# Patient Record
Sex: Male | Born: 1955 | Race: White | Hispanic: No | Marital: Married
Health system: Southern US, Community
[De-identification: ages and names within clinical notes are randomized; demographics above are authoritative.]

---

## 2014-07-05 ENCOUNTER — Ambulatory Visit (HOSPITAL_BASED_OUTPATIENT_CLINIC_OR_DEPARTMENT_OTHER)
Admission: RE | Admit: 2014-07-05 | Discharge: 2014-07-05 | Disposition: A | Discharge status: Home / Self Care | Payer: 59 | Source: Ambulatory Visit | Attending: Internal Medicine | Admitting: Internal Medicine

## 2014-07-05 ENCOUNTER — Other Ambulatory Visit (HOSPITAL_BASED_OUTPATIENT_CLINIC_OR_DEPARTMENT_OTHER): Payer: Self-pay | Admitting: Internal Medicine

## 2014-07-05 DIAGNOSIS — S8991XA Unspecified injury of right lower leg, initial encounter: Secondary | ICD-10-CM

## 2014-07-05 DIAGNOSIS — M79604 Pain in right leg: Secondary | ICD-10-CM

## 2014-07-05 DIAGNOSIS — R7989 Other specified abnormal findings of blood chemistry: Secondary | ICD-10-CM | POA: Diagnosis not present

## 2015-03-24 IMAGING — US US EXTREM LOW VENOUS*R*
1 series · 13 of 24 positions shown · non-contrast
Comparison: None.

CLINICAL DATA: Elevated D-dimer. Dog ran into the right knee and
knocked patient down.



[Series 1: us extrem low venous*right* · 0.07mm/px · 31 acquisitions, 13 frames shown]
[im 1/31]
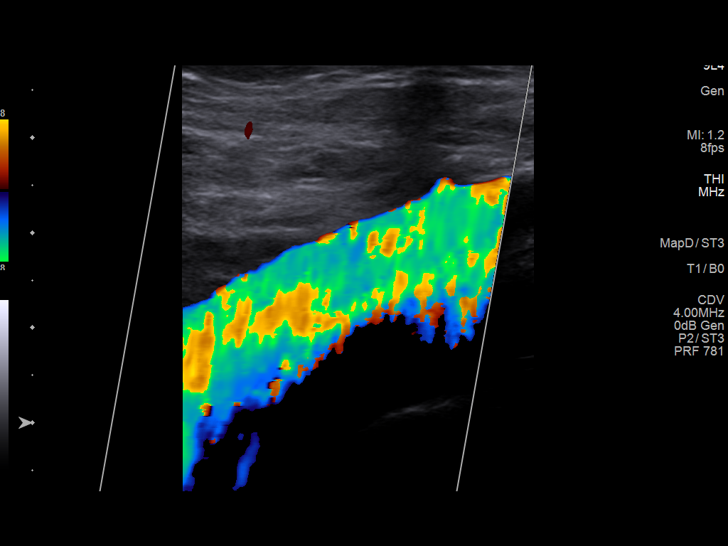
[im 3/31]
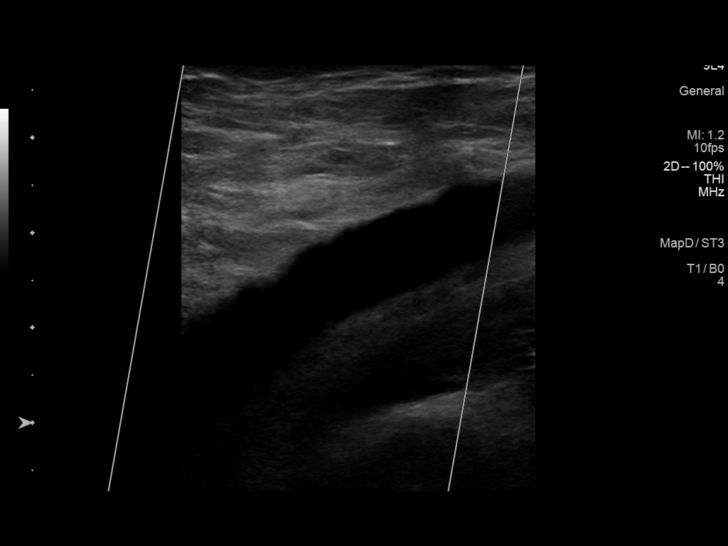
[im 6/31]
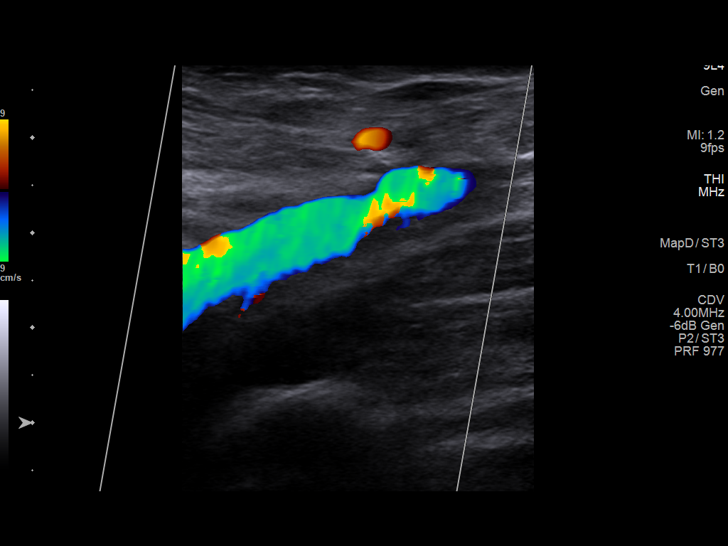
[im 10/31]
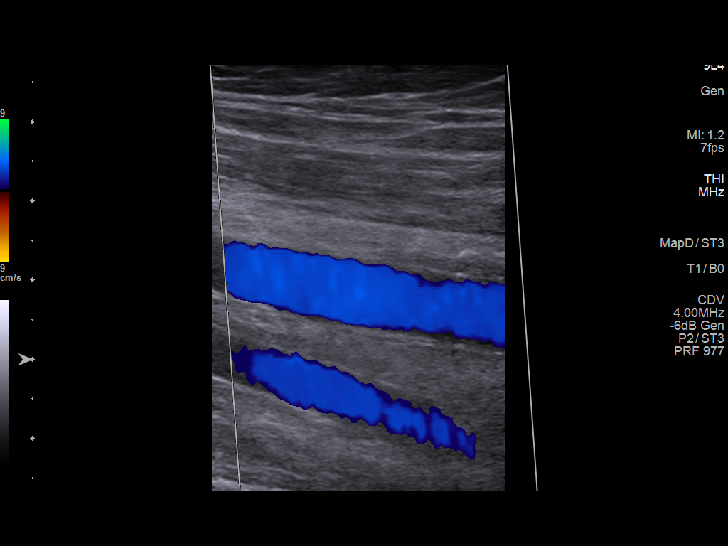
[im 12/31]
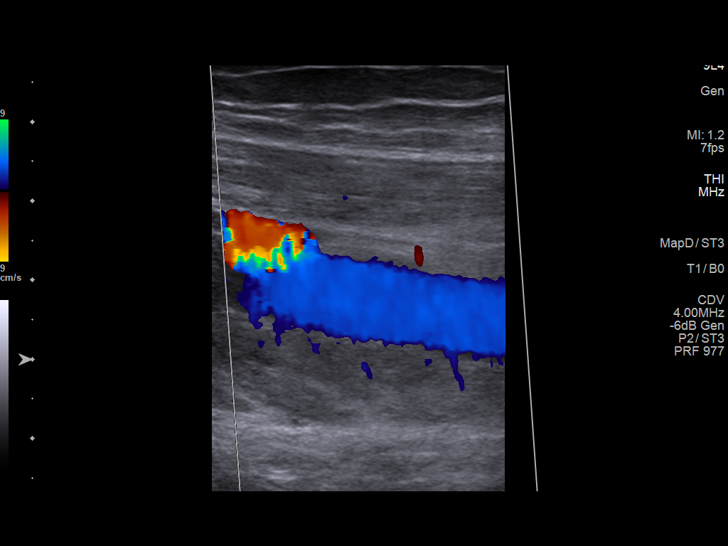
[im 15/31]
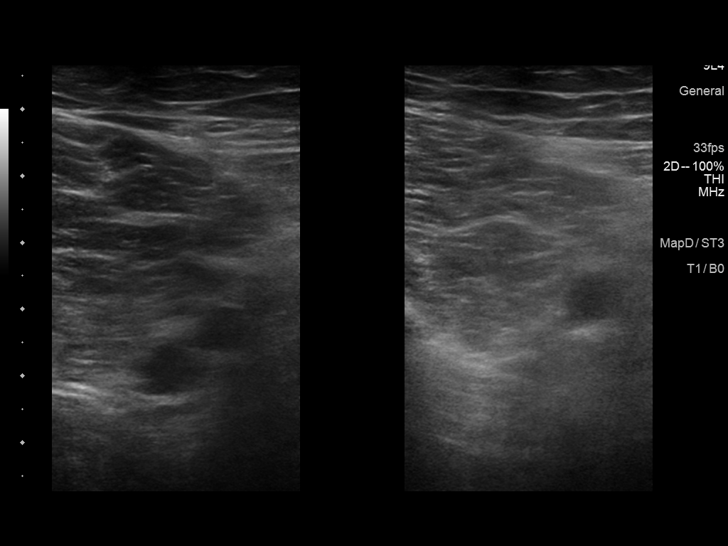
[im 17/31]
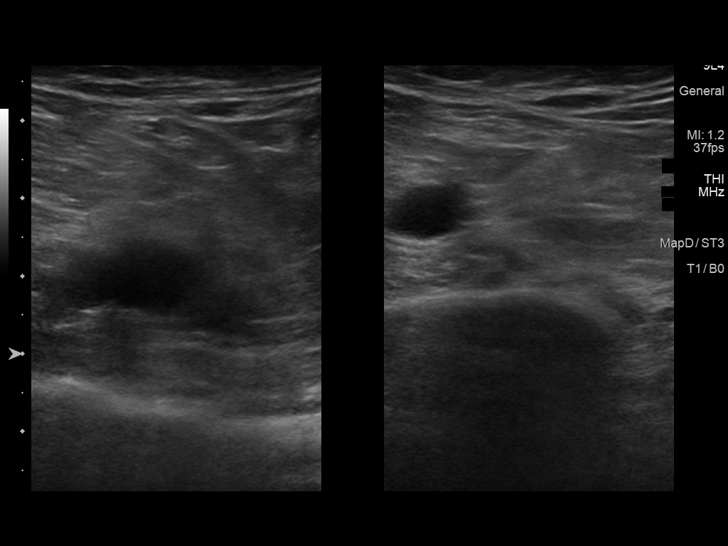
[im 19/31]
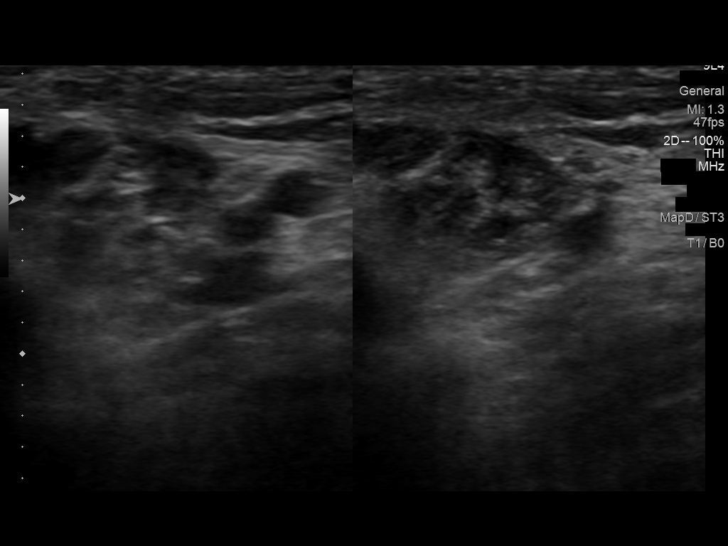
[im 21/31]
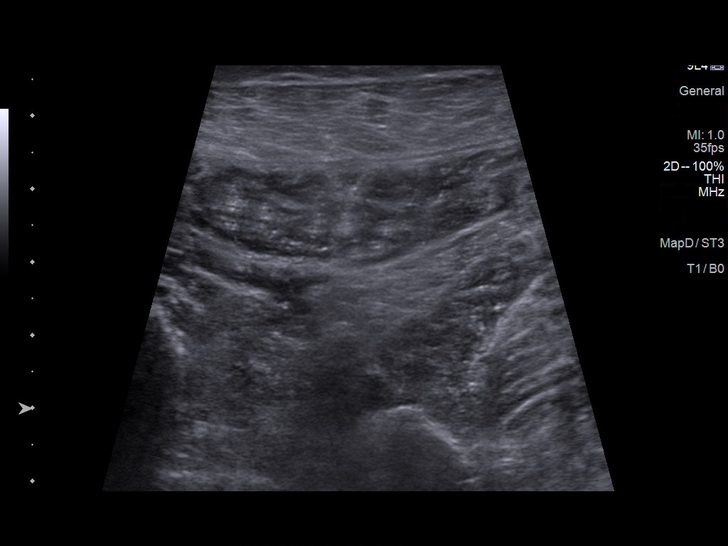
[im 23/31]
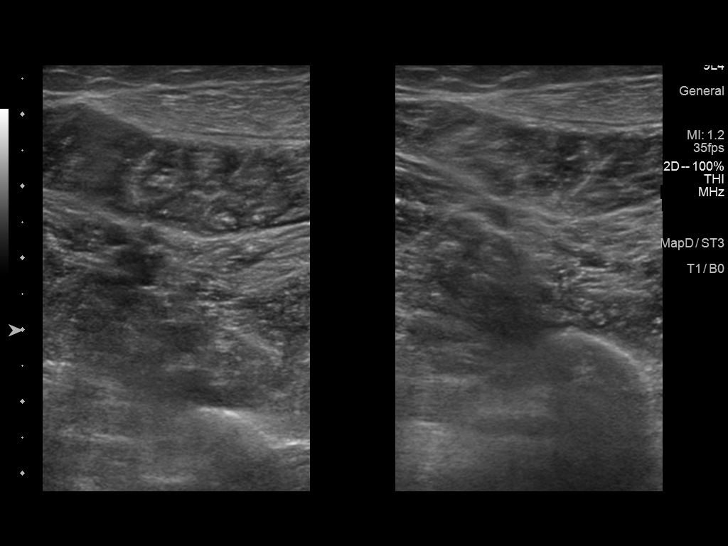
[im 27/31]
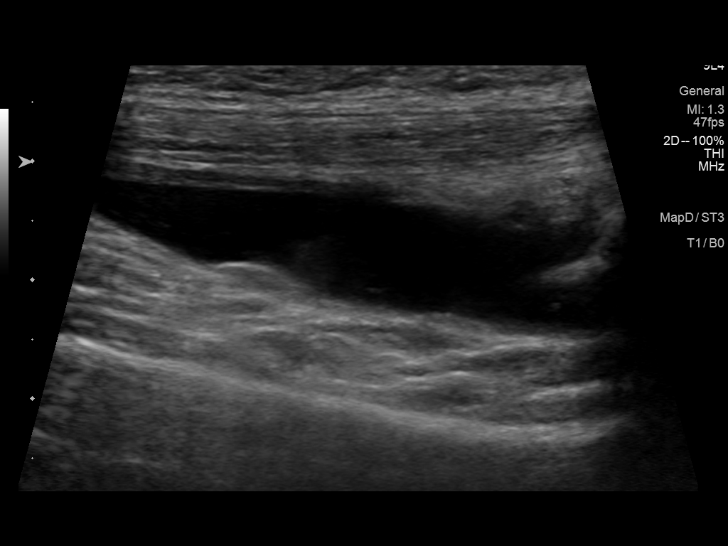
[im 29/31]
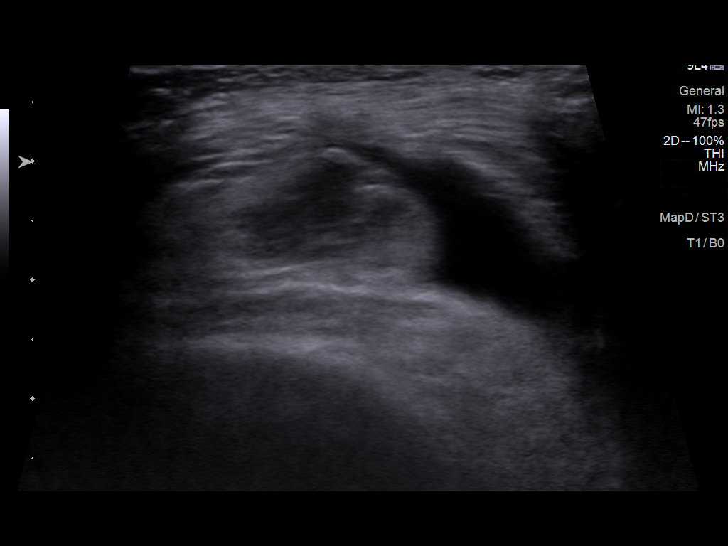
[im 31/31]
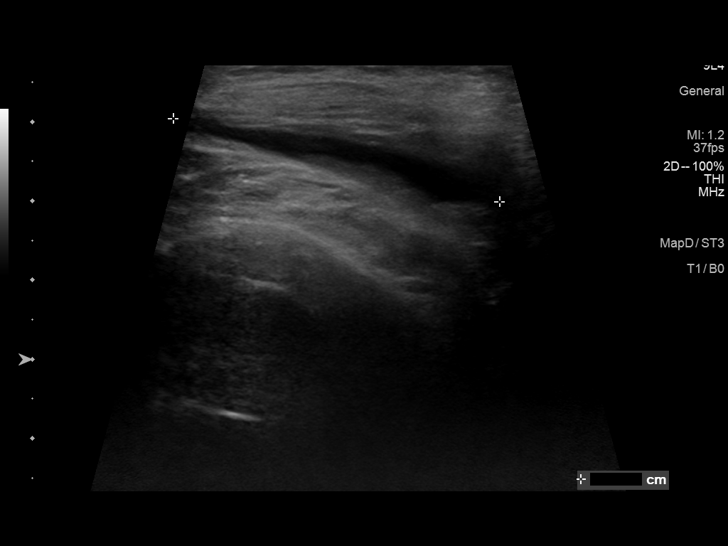

[13 of 24 positions shown; findings below may reference images not displayed]

FINDINGS: Common Femoral Vein: No evidence of thrombus. Normal
compressibility, respiratory phasicity and response to augmentation.

Saphenofemoral Junction: No evidence of thrombus. Normal
compressibility and flow on color Doppler imaging.

Profunda Femoral Vein: No evidence of thrombus. Normal
compressibility and flow on color Doppler imaging.

Femoral Vein: No evidence of thrombus. Normal compressibility,
respiratory phasicity and response to augmentation.

Popliteal Vein: No evidence of thrombus. Normal compressibility,
respiratory phasicity and response to augmentation.

Calf Veins: No evidence of thrombus. Normal compressibility and flow
on color Doppler imaging.

Superficial Great Saphenous Vein: No evidence of thrombus. Normal
compressibility and flow on color Doppler imaging.

Venous Reflux:  None.

Other Findings: Superior and lateral to the patella there is a fluid
collection which measures 4.8 x 2.9 x 4.3 cm.
IMPRESSION: 1. No evidence for occlusive deep venous thrombosis.
2. Fluid collection or joint effusion seen superior and lateral to
the patella.

## 2022-05-27 ENCOUNTER — Other Ambulatory Visit (HOSPITAL_COMMUNITY): Payer: Self-pay
